# Patient Record
Sex: Male | Born: 2000 | Race: White | Hispanic: No | Marital: Single | Smoking: Never smoker
Health system: Southern US, Community
[De-identification: ages and names within clinical notes are randomized; demographics above are authoritative.]

## PROBLEM LIST (undated history)

## (undated) DIAGNOSIS — Z9889 Other specified postprocedural states: Secondary | ICD-10-CM

## (undated) DIAGNOSIS — R112 Nausea with vomiting, unspecified: Secondary | ICD-10-CM

## (undated) DIAGNOSIS — T4145XA Adverse effect of unspecified anesthetic, initial encounter: Secondary | ICD-10-CM

## (undated) DIAGNOSIS — S83519A Sprain of anterior cruciate ligament of unspecified knee, initial encounter: Secondary | ICD-10-CM

## (undated) DIAGNOSIS — T8859XA Other complications of anesthesia, initial encounter: Secondary | ICD-10-CM

## (undated) DIAGNOSIS — S82851A Displaced trimalleolar fracture of right lower leg, initial encounter for closed fracture: Secondary | ICD-10-CM

## (undated) HISTORY — PX: ANTERIOR CRUCIATE LIGAMENT REPAIR: SHX115

## (undated) HISTORY — PX: SHOULDER SURGERY: SHX246

---
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
## 2018-03-16 IMAGING — CR DG TIBIA/FIBULA 2V*R*
4 series · 4 of 4 positions shown · non-contrast
Comparison: None.

CLINICAL DATA: Right ankle lower leg pain after football injury.

EXAM:
RIGHT TIBIA AND FIBULA - 2 VIEW

[tibia ap (1 of 2)]
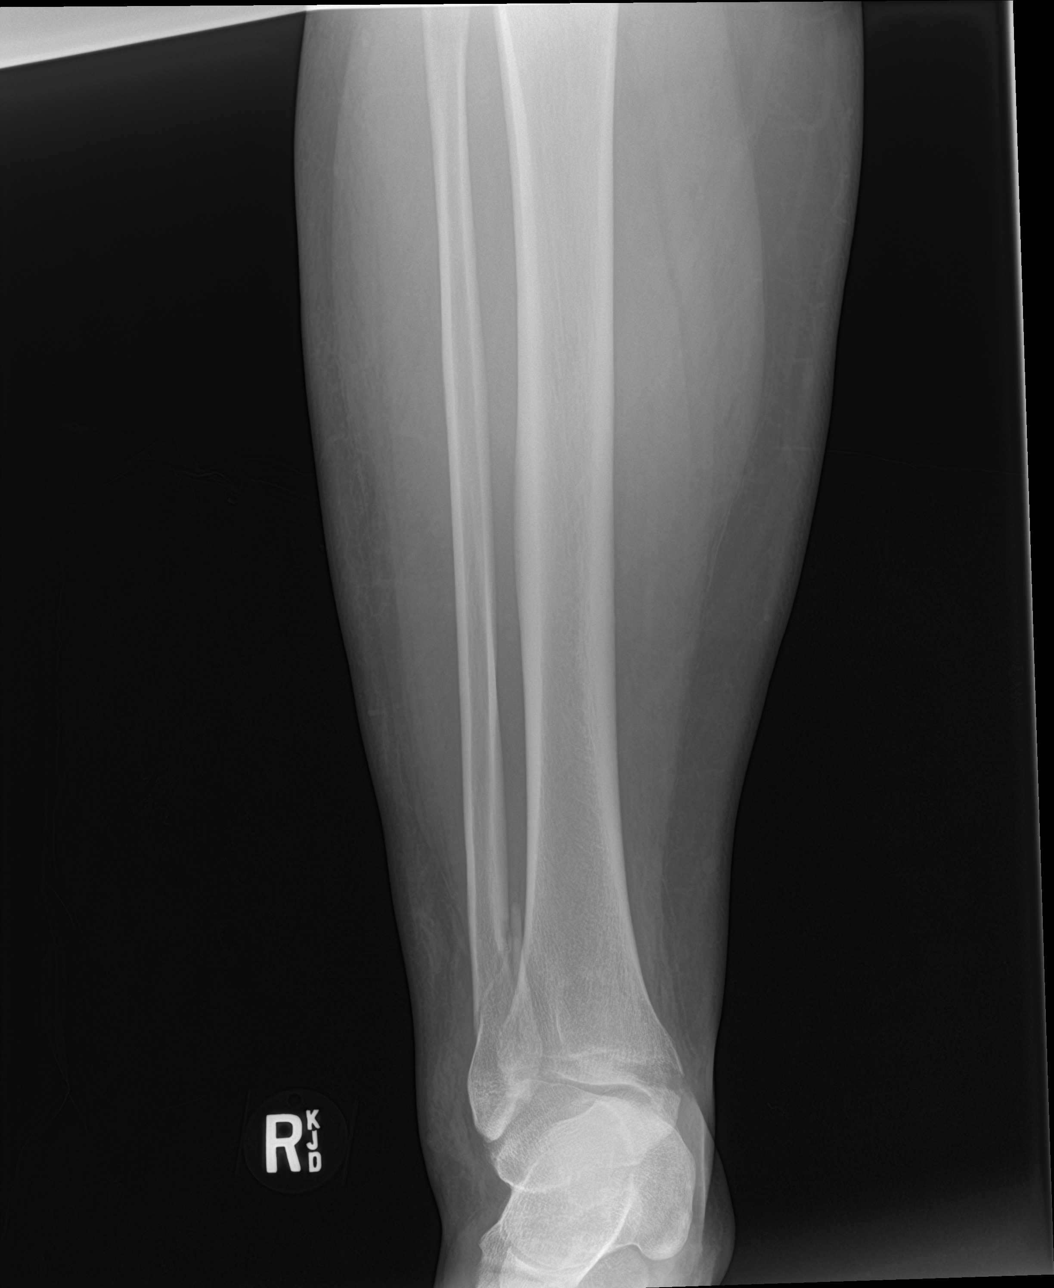

[tibia ap (2 of 2)]
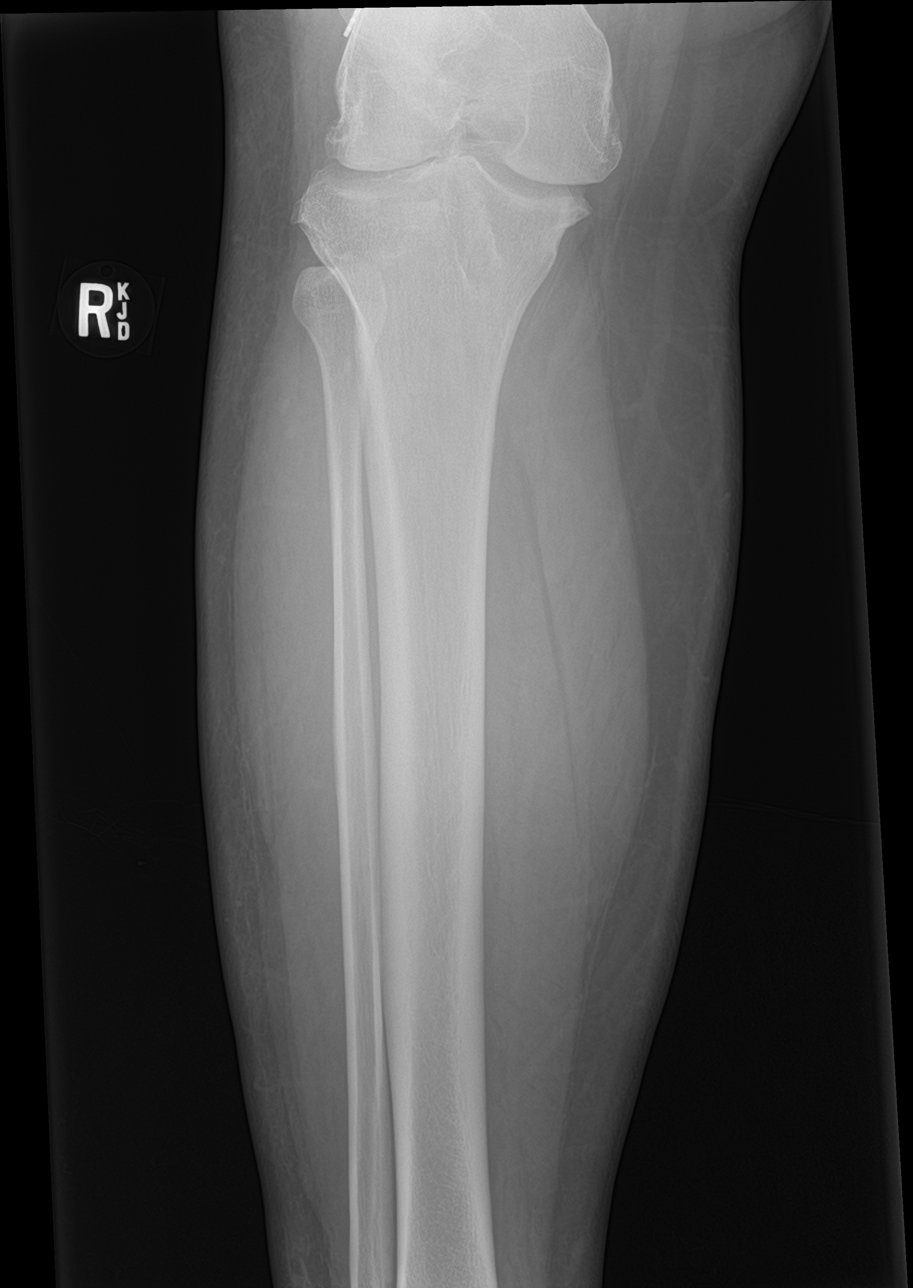

[tibia lat (1 of 2)]
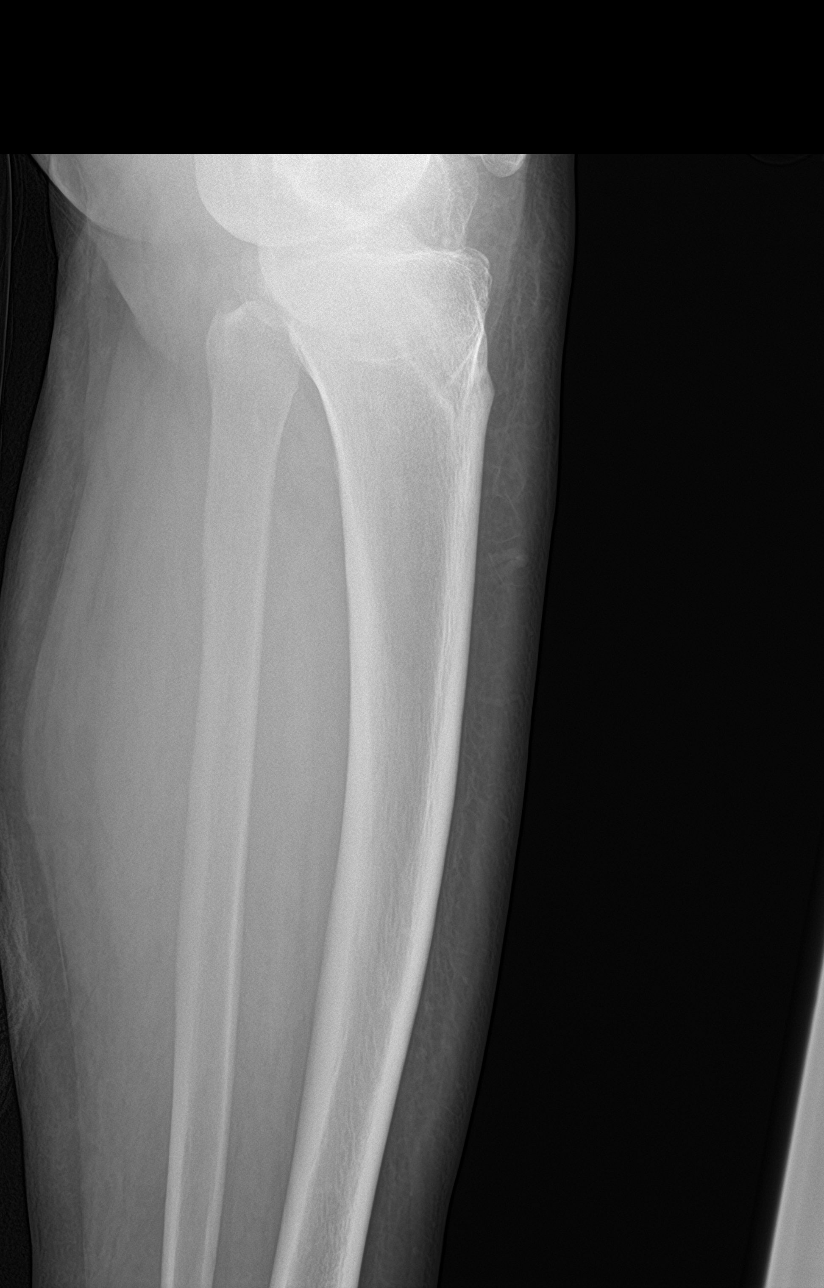

[tibia lat (2 of 2)]
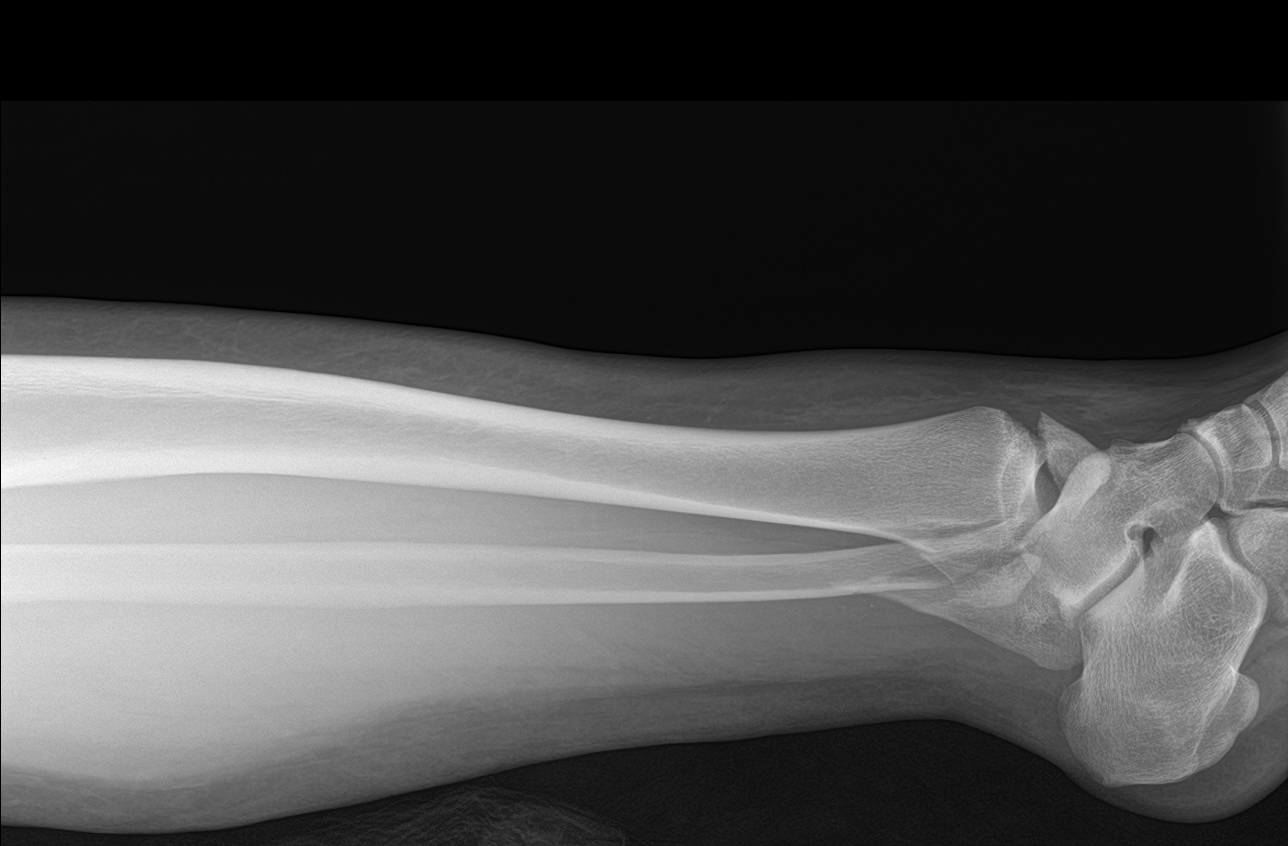

[4 of 4 positions shown; findings below may reference images not displayed]

FINDINGS: Fractures of the distal tibia and fibula, better assessed on
concurrent ankle radiographs. The proximal tibia and fibula are
intact. Postsurgical change of the knee with probable ACL repair.
Age advanced knee osteoarthritis.
IMPRESSION: Ankle fractures better assessed on dedicated ankle radiographs.
Proximal tibia and fibula are intact.

Osteoarthritis of the knee, unusual for age and likely sequela of
prior traumatic injury.
